# Patient Record
Sex: Female | Born: 1940
Health system: Southern US, Community
[De-identification: ages and names within clinical notes are randomized; demographics above are authoritative.]

## PROBLEM LIST (undated history)

## (undated) DIAGNOSIS — F419 Anxiety disorder, unspecified: Secondary | ICD-10-CM

## (undated) DIAGNOSIS — E785 Hyperlipidemia, unspecified: Secondary | ICD-10-CM

## (undated) DIAGNOSIS — K589 Irritable bowel syndrome without diarrhea: Secondary | ICD-10-CM

## (undated) DIAGNOSIS — M810 Age-related osteoporosis without current pathological fracture: Secondary | ICD-10-CM

## (undated) DIAGNOSIS — I10 Essential (primary) hypertension: Secondary | ICD-10-CM

## (undated) HISTORY — DX: Hyperlipidemia, unspecified: E78.5

## (undated) HISTORY — DX: Anxiety disorder, unspecified: F41.9

## (undated) HISTORY — DX: Irritable bowel syndrome, unspecified: K58.9

## (undated) HISTORY — DX: Age-related osteoporosis without current pathological fracture: M81.0

## (undated) HISTORY — PX: BREAST FIBROADENOMA SURGERY: SHX580

---

## 2010-08-02 ENCOUNTER — Other Ambulatory Visit: Payer: Self-pay | Admitting: Internal Medicine

## 2010-08-02 DIAGNOSIS — Z1231 Encounter for screening mammogram for malignant neoplasm of breast: Secondary | ICD-10-CM

## 2010-08-21 ENCOUNTER — Ambulatory Visit: Payer: Self-pay

## 2010-11-24 ENCOUNTER — Encounter: Payer: Self-pay | Admitting: Internal Medicine

## 2011-08-09 ENCOUNTER — Encounter (HOSPITAL_COMMUNITY): Payer: Self-pay | Admitting: *Deleted

## 2011-08-09 ENCOUNTER — Emergency Department (INDEPENDENT_AMBULATORY_CARE_PROVIDER_SITE_OTHER)
Admission: EM | Admit: 2011-08-09 | Discharge: 2011-08-09 | Disposition: A | Discharge status: Home / Self Care | Payer: Medicare Other | Source: Home / Self Care | Attending: Emergency Medicine | Admitting: Emergency Medicine

## 2011-08-09 DIAGNOSIS — K047 Periapical abscess without sinus: Secondary | ICD-10-CM

## 2011-08-09 HISTORY — DX: Essential (primary) hypertension: I10

## 2011-08-09 MED ORDER — PENICILLIN V POTASSIUM 500 MG PO TABS: 500.0000 mg | ORAL_TABLET | Freq: Four times a day (QID) | ORAL | Status: AC

## 2011-08-09 MED ORDER — HYDROCODONE-ACETAMINOPHEN 5-325 MG PO TABS: 2.0000 | ORAL_TABLET | ORAL | Status: AC | PRN

## 2011-08-09 NOTE — ED Provider Notes (Addendum)
History     CSN: [id number]  Arrival date & time 08/09/11  1741   First MD Initiated Contact with Patient 08/09/11 1743      Chief Complaint  Patient presents with  . Dental Pain    (Consider location/radiation/quality/duration/timing/severity/associated sxs/prior treatment) HPI Comments: Carla Moore was feeling some throbbing pain on my right lower tooth but this morning woke up with swelling and more pain and looks like him have a little pocket beside my tooth there., Dentist, to the emergency department to get antibiotics. He will see me but they are close Friday.  Patient is a 71 y.o. female presenting with tooth pain. The history is provided by the patient.  Dental PainThe primary symptoms include mouth pain. Primary symptoms do not include oral bleeding, fever, sore throat, angioedema or cough. The symptoms began 2 days ago. The symptoms are worsening. The symptoms occur constantly.  Additional symptoms include: gum swelling, gum tenderness, facial swelling and pain with swallowing. Additional symptoms do not include: smell disturbance and fatigue.    Past Medical History  Diagnosis Date  . Hypertension     History reviewed. No pertinent past surgical history.  History reviewed. No pertinent family history.  History  Substance Use Topics  . Smoking status: Not on file  . Smokeless tobacco: Not on file  . Alcohol Use: Yes     2 drinks daily    OB History    Grav Para Term Preterm Abortions TAB SAB Ect Mult Living                  Review of Systems  Constitutional: Negative for fever, appetite change and fatigue.  HENT: Positive for facial swelling. Negative for sore throat.   Eyes: Negative for pain.  Respiratory: Negative for cough.   Cardiovascular: Negative for chest pain.  Genitourinary: Negative for dysuria and flank pain.  Musculoskeletal: Negative for arthralgias.    Allergies  Review of patient's allergies indicates no known allergies.  Home  Medications   Current Outpatient Rx  Name Route Sig Dispense Refill  . ATENOLOL PO Oral Take by mouth.    Marland Kitchen LISINOPRIL 20 MG PO TABS Oral Take 20 mg by mouth daily.    Marland Kitchen HYDROCODONE-ACETAMINOPHEN 5-325 MG PO TABS Oral Take 2 tablets by mouth every 4 (four) hours as needed for pain. 10 tablet 0  . PENICILLIN V POTASSIUM 500 MG PO TABS Oral Take 1 tablet (500 mg total) by mouth 4 (four) times daily. 30 tablet 0    BP 143/62  Pulse 53  Temp(Src) 97.3 F (36.3 C) (Oral)  Resp 16  SpO2 100%  Physical Exam  Nursing note and vitals reviewed. Constitutional: She appears well-developed and well-nourished. No distress.  HENT:  Head: Normocephalic.  Eyes: Conjunctivae are normal.  Neck: Neck supple.  Cardiovascular: Exam reveals no gallop.   No murmur heard. Pulmonary/Chest: No respiratory distress.  Abdominal: Soft. She exhibits no distension.  Skin: No erythema.    ED Course  Procedures (including critical care time)  Labs Reviewed - No data to display No results found.   No diagnosis found.    MDM  Right sided premolar with early abscess formation.        Carla Molly, MD 08/09/11 Carla Moore  Carla Molly, MD 08/09/11 [phone]

## 2011-08-09 NOTE — ED Notes (Signed)
Started with pain in right lower tooth this AM; reports feeling "a pocket".  Pt visiting from out of town.

## 2013-09-23 ENCOUNTER — Emergency Department (HOSPITAL_COMMUNITY)
Admission: EM | Admit: 2013-09-23 | Discharge: 2013-09-23 | Disposition: A | Discharge status: Home / Self Care | Payer: Medicare Other | Source: Home / Self Care

## 2013-09-23 ENCOUNTER — Encounter (HOSPITAL_COMMUNITY): Payer: Self-pay | Admitting: Emergency Medicine

## 2013-09-23 DIAGNOSIS — M79646 Pain in unspecified finger(s): Secondary | ICD-10-CM

## 2013-09-23 DIAGNOSIS — M79609 Pain in unspecified limb: Secondary | ICD-10-CM

## 2013-09-23 DIAGNOSIS — I10 Essential (primary) hypertension: Secondary | ICD-10-CM

## 2013-09-23 DIAGNOSIS — E785 Hyperlipidemia, unspecified: Secondary | ICD-10-CM

## 2013-09-23 LAB — COMPREHENSIVE METABOLIC PANEL
ALBUMIN: 4.5 g/dL (ref 3.5–5.2)
ALK PHOS: 56 U/L (ref 39–117)
ALT: 22 U/L (ref 0–35)
AST: 31 U/L (ref 0–37)
BILIRUBIN TOTAL: 0.9 mg/dL (ref 0.3–1.2)
BUN: 15 mg/dL (ref 6–23)
CHLORIDE: 96 meq/L (ref 96–112)
CO2: 25 mEq/L (ref 19–32)
CREATININE: 0.78 mg/dL (ref 0.50–1.10)
Calcium: 9.6 mg/dL (ref 8.4–10.5)
GFR calc Af Amer: >90 mL/min (ref >90)
GFR calc non Af Amer: 82 mL/min — ABNORMAL LOW (ref >90)
Glucose, Bld: 91 mg/dL (ref 70–99)
POTASSIUM: 4.3 meq/L (ref 3.7–5.3)
Sodium: 136 mEq/L — ABNORMAL LOW (ref 137–147)
Total Protein: 7.3 g/dL (ref 6.0–8.3)

## 2013-09-23 LAB — RHEUMATOID FACTOR

## 2013-09-23 LAB — SEDIMENTATION RATE: Sed Rate: 13 mm/hr (ref 0–22)

## 2013-09-23 LAB — URIC ACID: URIC ACID, SERUM: 6.4 mg/dL (ref 2.4–7.0)

## 2013-09-23 LAB — C-REACTIVE PROTEIN: CRP: <0.5 mg/dL — ABNORMAL LOW (ref <0.60)

## 2013-09-23 NOTE — Discharge Instructions (Signed)
The cause of your finger pain is not clear but may be related to gout, osteoarthritis, or rheumatoid arthritis (though unlikely ).  Pelase start taking 1-2 Aleve twice a day for 1-2 weeks Please follow up on your blood work You no longer need cholesterol medications.

## 2013-09-23 NOTE — ED Notes (Signed)
Pt  Reports  Symptoms  Of  pain  In  r  Hand  With  Some  Pain when  Closes  Her  Hand  denys  Any  Injury  Cap   Refill  Intact      denys  Any  Injury      No  Obvious  Deformity  Noted

## 2013-09-23 NOTE — ED Provider Notes (Signed)
CSN: [id number]     Arrival date & time 09/23/13  1315 History   None    Chief Complaint  Patient presents with  . Hand Problem   (Consider location/radiation/quality/duration/timing/severity/associated sxs/prior Treatment) HPI  R index finger pain: started 4 wks ago. Worse in the am. No recent trauma. Ache in nature. Non-radiating. Worse w/ flexion. Pain improves as the day goes on. Not taking medications. No change in status over the past couple of weeks.   HLD: pt reports taking cholesterol medications in the past. Ordered by physician back in [facility]. Out now but presenting lipid panel labs showing total cholesterol 216, HDL 100, LDL 100. A1c 5.7  HTN: Denies CP, SOB, palpiations. Taking atenolol. Self checks at home and in stores at a max of 140.    Past Medical History  Diagnosis Date  . Hypertension    History reviewed. No pertinent past surgical history. History reviewed. No pertinent family history. History  Substance Use Topics  . Smoking status: Not on file  . Smokeless tobacco: Not on file  . Alcohol Use: Yes     Comment: 2 drinks daily   OB History   Grav Para Term Preterm Abortions TAB SAB Ect Mult Living                 Review of Systems  Constitutional: Negative for fever and activity change.  Respiratory: Negative for shortness of breath and wheezing.   Cardiovascular: Negative for chest pain.  Musculoskeletal: Positive for arthralgias. Negative for back pain, neck pain and neck stiffness.  All other systems reviewed and are negative.   Allergies  Review of patient's allergies indicates no known allergies.  Home Medications   Prior to Admission medications   Medication Sig Start Date End Date Taking? Authorizing Provider  ATENOLOL PO Take by mouth.    Historical Provider, MD  lisinopril (PRINIVIL,ZESTRIL) 20 MG tablet Take 20 mg by mouth daily.    Historical Provider, MD   BP 179/81  Pulse 53  Temp(Src) 98 F (36.7 C) (Oral)  Resp 16   SpO2 100% Physical Exam  Constitutional: She is oriented to person, place, and time. She appears well-developed and well-nourished. No distress.  HENT:  Head: Atraumatic.  Eyes: EOM are normal. Pupils are equal, round, and reactive to light.  Neck: Normal range of motion. Neck supple.  Cardiovascular: Normal rate, regular rhythm, normal heart sounds and intact distal pulses.  Exam reveals no gallop.   No murmur heard. Pulmonary/Chest: Effort normal and breath sounds normal. No respiratory distress. She has no wheezes. She exhibits no tenderness.  Abdominal: Soft. Bowel sounds are normal.  Musculoskeletal:  R 3rd and 4th digits w/ mild soft tissue swelling and nml ROM. No effustion or enlargement/displacement of the MCP, PIP, DIP. Mildly ttp  Neurological: She is alert and oriented to person, place, and time.  Skin: Skin is warm and dry. She is not diaphoretic.  Psychiatric: She has a normal mood and affect. Her behavior is normal. Judgment and thought content normal.    ED Course  Procedures (including critical care time) Labs Review Labs Reviewed  URIC ACID  COMPREHENSIVE METABOLIC PANEL  SEDIMENTATION RATE  C-REACTIVE PROTEIN    Imaging Review No results found.   MDM   1. Finger pain   2. HLD (hyperlipidemia)   3. HTN (hypertension)    Etiology of finger pain unclear. May be related to OA, gout, or RA. Unlikely RA.  - Aleve 1-2mg  BID x 7-14 days -  will obtain Rheumatoid factor, sed rate, CRP, Uric Acid level. CMT - Lipid panel reviewed and no need for medical therapy at this time as 10 yr ASCVD risk 5.1.  HTN - no change in thereapy despite elevation today as typically nml. F/u pcp  Linna Darner, MD Family Medicine PGY-3 09/23/2013, 4:08 PM       Waldemar Dickens, MD 09/23/13 [phone]

## 2013-09-24 NOTE — ED Provider Notes (Signed)
Medical screening examination/treatment/procedure(s) were performed by a resident physician or non-physician practitioner and as the supervising physician I was immediately available for consultation/collaboration.  Breyona Swander, MD    Zennie Ayars S Samera Macy, MD 09/24/13 0752 

## 2013-09-27 NOTE — ED Notes (Signed)
Dr. Marily Memos has reviewed labs and no further action needed. [facility] 09/27/2013

## 2015-04-08 ENCOUNTER — Emergency Department (INDEPENDENT_AMBULATORY_CARE_PROVIDER_SITE_OTHER)
Admission: EM | Admit: 2015-04-08 | Discharge: 2015-04-08 | Disposition: A | Discharge status: Home / Self Care | Payer: PPO | Source: Home / Self Care

## 2015-04-08 DIAGNOSIS — A46 Erysipelas: Secondary | ICD-10-CM | POA: Diagnosis not present

## 2015-04-08 DIAGNOSIS — L03811 Cellulitis of head [any part, except face]: Secondary | ICD-10-CM | POA: Diagnosis not present

## 2015-04-08 MED ORDER — PREDNISONE 20 MG PO TABS: ORAL_TABLET | ORAL | Status: DC

## 2015-04-08 MED ORDER — METHYLPREDNISOLONE SODIUM SUCC 125 MG IJ SOLR
INTRAMUSCULAR | Status: AC
  Filled 2015-04-08: qty 2

## 2015-04-08 MED ORDER — METHYLPREDNISOLONE SODIUM SUCC 125 MG IJ SOLR
125.0000 mg | Freq: Once | INTRAMUSCULAR | Status: AC
  Administered 2015-04-08: 125 mg via INTRAMUSCULAR

## 2015-04-08 MED ORDER — CEPHALEXIN 500 MG PO CAPS: 250.0000 mg | ORAL_CAPSULE | Freq: Three times a day (TID) | ORAL | Status: DC

## 2015-04-08 NOTE — ED Notes (Signed)
Pt here with swelling and bite marks all over after staying overight in [place] Pruritis noted with oozing  BP elevated 191/76 took only one BP med

## 2015-04-08 NOTE — Discharge Instructions (Signed)
Erysipelas Erysipelas is an infection that affects the skin and the tissues that are near the surface of the skin. It causes the skin to become red, swollen, and painful. The infection is most common on the legs but may also affect other areas, such as the face. With treatment, the infection usually goes away in a few days. If not treated, the infection can spread or lead to other problems, such as abscesses. CAUSES Erysipelas is caused by bacteria. Most often, it is caused by bacteria called streptococci. The bacteria often enter through a break in the skin, such as a cut, surgical incision, burn, insect bite, open sore, or crack in the skin. Sometimes the source where the bacteria entered is not known. RISK FACTORS Some people are at an increased risk for developing erysipelas, including:  Young children.  Elderly people.  People with a weakened body defense system (immune system), such as people with HIV or AIDS.  People who have diabetes.  People who drink too much alcohol.  People who have had recent surgery.  People with yeast infections of the skin.  People who have swollen legs. SIGNS AND SYMPTOMS The infection causes a reddened area on the skin. This reddened area may:  Be painful and swollen.  Have a distinct border around it.  Feel itchy and hot.  Develop blisters. Other symptoms may include:  Fever.  Chills.  Nausea and vomiting.  Swollen glands (lymph nodes).  Headache.  Fatigue.  Loss of appetite. DIAGNOSIS Your health care provider will take your medical history and do a physical exam. He or she will usually be able to diagnose erysipelas by closely examining your skin. TREATMENT Erysipelas can usually be treated effectively with antibiotic medicines. The infection usually gets better within a few days of treatment. HOME CARE INSTRUCTIONS  Take medicines only as directed by your health care provider.  Take your antibiotic medicine as directed by  your health care provider. Finish the antibiotic even if you start to feel better.  If the skin infection is on your leg or arm, elevate the leg or arm to help reduce swelling.  Do not put creams or lotions on the affected area of your skin unless your health care provider instructs you to do that.  Do not share bedding, towels, or washcloths (linens) with other people. Using only your own linens will help to prevent the infection from spreading to others.  Keep all follow-up visits as directed by your health care provider. This is important. SEEK MEDICAL CARE IF:  You have pain or discomfort that is not controlled by medicines.  Your red area of skin gets larger or turns dark in color.  Your skin infection returns in the same area or appears in another area. SEEK IMMEDIATE MEDICAL CARE IF:  Your fever is getting worse.  Your feelings of illness are getting worse.  You notice red streaks coming from the infected area.   This information is not intended to replace advice given to you by your health care provider. Make sure you discuss any questions you have with your health care provider.   Document Released: 01/16/2001 Document Revised: 05/14/2014 Document Reviewed: 12/07/2013 Elsevier Interactive Patient Education 2016 [employer] Cellulitis Cellulitis is an infection of the skin and the tissue under the skin. The infected area is usually red and tender. This happens most often in the arms and lower legs. HOME CARE   Take your antibiotic medicine as told. Finish the medicine even if you start to  feel better.  Keep the infected arm or leg raised (elevated).  Put a warm cloth on the area up to 4 times per day.  Only take medicines as told by your doctor.  Keep all doctor visits as told. GET HELP IF:  You see red streaks on the skin coming from the infected area.  Your red area gets bigger or turns a dark color.  Your bone or joint under the infected area is painful  after the skin heals.  Your infection comes back in the same area or different area.  You have a puffy (swollen) bump in the infected area.  You have new symptoms.  You have a fever. GET HELP RIGHT AWAY IF:   You feel very sleepy.  You throw up (vomit) or have watery poop (diarrhea).  You feel sick and have muscle aches and pains.   This information is not intended to replace advice given to you by your health care provider. Make sure you discuss any questions you have with your health care provider.   Document Released: 10/10/2007 Document Revised: 01/12/2015 Document Reviewed: 07/09/2011 Elsevier Interactive Patient Education [employer].

## 2015-04-08 NOTE — ED Notes (Addendum)
Patient states she stayed in a hotel last PM, and awoke this AM w several "insect bites" on hands, legs. States the bites have been itching, turning red, swelling. NAD, handling secretions well. Area on chin, hands, lower extremities noted to be red. Appears anxious

## 2015-04-13 NOTE — ED Provider Notes (Signed)
CSN: [id number]     Arrival date & time 04/08/15  1636 History   None    Chief Complaint  Patient presents with  . Insect Bite  . Pruritis   (Consider location/radiation/quality/duration/timing/severity/associated sxs/prior Treatment) HPI Pt states that she slept in a hotel yesterday and awoke with itchy bites covering her body. No home treatment no pain, mostly itch.  Past Medical History  Diagnosis Date  . Hypertension    No past surgical history on file. No family history on file. Social History  Substance Use Topics  . Smoking status: Not on file  . Smokeless tobacco: Not on file  . Alcohol Use: Yes     Comment: 2 drinks daily   OB History    No data available     Review of Systems Itchy skin Denies fever, chills, other rash Allergies  Review of patient's allergies indicates no known allergies.  Home Medications   Prior to Admission medications   Medication Sig Start Date End Date Taking? Authorizing Provider  ATENOLOL PO Take by mouth.    Historical Provider, MD  cephALEXin (KEFLEX) 500 MG capsule Take 1 capsule (500 mg total) by mouth 3 (three) times daily. 04/08/15   Konrad Felix, PA  lisinopril (PRINIVIL,ZESTRIL) 20 MG tablet Take 20 mg by mouth daily.    Historical Provider, MD  predniSONE (DELTASONE) 20 MG tablet 40 mg daily for 7 days 04/08/15   Konrad Felix, PA   Meds Ordered and Administered this Visit   Medications  methylPREDNISolone sodium succinate (SOLU-MEDROL) 125 mg/2 mL injection 125 mg (125 mg Intramuscular Given 04/08/15 1811)    There were no vitals taken for this visit. No data found.   Physical Exam  Constitutional: She is oriented to person, place, and time. She appears well-developed and well-nourished. No distress.  HENT:  Head: Normocephalic and atraumatic.  Pulmonary/Chest: Effort normal.  Neurological: She is alert and oriented to person, place, and time.  Skin: Skin is warm. Rash noted.  Papular rash on arms neck thorax  and face. There is some redness and swelling to face. Warm to touch.  Nursing note and vitals reviewed.   ED Course  Procedures (including critical care time)  Labs Review Labs Reviewed - No data to display  Imaging Review No results found.   Visual Acuity Review  Right Eye Distance:   Left Eye Distance:   Bilateral Distance:    Right Eye Near:   Left Eye Near:    Bilateral Near:         MDM   1. Erysipelas   2. Cellulitis of head except face    Unsure of cause of these lesions on patient's body, but they do appear to be diffuse insect bites.  Rx.antibx used (keflex also prednisone) Home in stable condition    Konrad Felix, [place] 04/13/15 1316

## 2015-09-20 DIAGNOSIS — I10 Essential (primary) hypertension: Secondary | ICD-10-CM | POA: Diagnosis not present

## 2015-09-27 DIAGNOSIS — Z23 Encounter for immunization: Secondary | ICD-10-CM | POA: Diagnosis not present

## 2015-09-27 DIAGNOSIS — R69 Illness, unspecified: Secondary | ICD-10-CM | POA: Diagnosis not present

## 2015-09-27 DIAGNOSIS — Z681 Body mass index (BMI) 19 or less, adult: Secondary | ICD-10-CM | POA: Diagnosis not present

## 2015-09-27 DIAGNOSIS — I10 Essential (primary) hypertension: Secondary | ICD-10-CM | POA: Diagnosis not present

## 2015-09-27 DIAGNOSIS — Z1389 Encounter for screening for other disorder: Secondary | ICD-10-CM | POA: Diagnosis not present

## 2015-09-27 DIAGNOSIS — G4700 Insomnia, unspecified: Secondary | ICD-10-CM | POA: Diagnosis not present

## 2015-09-27 DIAGNOSIS — Z0000 Encounter for general adult medical examination without abnormal findings: Secondary | ICD-10-CM | POA: Diagnosis not present

## 2016-09-20 DIAGNOSIS — I10 Essential (primary) hypertension: Secondary | ICD-10-CM | POA: Diagnosis not present

## 2016-09-24 DIAGNOSIS — R69 Illness, unspecified: Secondary | ICD-10-CM | POA: Diagnosis not present

## 2016-09-27 DIAGNOSIS — E784 Other hyperlipidemia: Secondary | ICD-10-CM | POA: Diagnosis not present

## 2016-09-27 DIAGNOSIS — Z1389 Encounter for screening for other disorder: Secondary | ICD-10-CM | POA: Diagnosis not present

## 2016-09-27 DIAGNOSIS — R69 Illness, unspecified: Secondary | ICD-10-CM | POA: Diagnosis not present

## 2016-09-27 DIAGNOSIS — I10 Essential (primary) hypertension: Secondary | ICD-10-CM | POA: Diagnosis not present

## 2016-09-27 DIAGNOSIS — Z0000 Encounter for general adult medical examination without abnormal findings: Secondary | ICD-10-CM | POA: Diagnosis not present

## 2016-09-27 DIAGNOSIS — Z681 Body mass index (BMI) 19 or less, adult: Secondary | ICD-10-CM | POA: Diagnosis not present

## 2016-09-27 DIAGNOSIS — G4709 Other insomnia: Secondary | ICD-10-CM | POA: Diagnosis not present

## 2016-10-02 DIAGNOSIS — Z1212 Encounter for screening for malignant neoplasm of rectum: Secondary | ICD-10-CM | POA: Diagnosis not present

## 2016-10-03 DIAGNOSIS — R69 Illness, unspecified: Secondary | ICD-10-CM | POA: Diagnosis not present

## 2017-02-14 ENCOUNTER — Encounter (HOSPITAL_COMMUNITY): Payer: Self-pay | Admitting: Emergency Medicine

## 2017-02-14 ENCOUNTER — Ambulatory Visit (HOSPITAL_COMMUNITY)
Admission: EM | Admit: 2017-02-14 | Discharge: 2017-02-14 | Disposition: A | Discharge status: Home / Self Care | Payer: Medicare HMO | Attending: Family Medicine | Admitting: Family Medicine

## 2017-02-14 DIAGNOSIS — S50861A Insect bite (nonvenomous) of right forearm, initial encounter: Secondary | ICD-10-CM

## 2017-02-14 DIAGNOSIS — W57XXXA Bitten or stung by nonvenomous insect and other nonvenomous arthropods, initial encounter: Secondary | ICD-10-CM

## 2017-02-14 DIAGNOSIS — S50862A Insect bite (nonvenomous) of left forearm, initial encounter: Secondary | ICD-10-CM

## 2017-02-14 DIAGNOSIS — S60562A Insect bite (nonvenomous) of left hand, initial encounter: Secondary | ICD-10-CM | POA: Diagnosis not present

## 2017-02-14 MED ORDER — METHYLPREDNISOLONE ACETATE 80 MG/ML IJ SUSP
80.0000 mg | Freq: Once | INTRAMUSCULAR | Status: AC
  Administered 2017-02-14: 80 mg via INTRAMUSCULAR

## 2017-02-14 MED ORDER — METHYLPREDNISOLONE ACETATE 80 MG/ML IJ SUSP
INTRAMUSCULAR | Status: AC
  Filled 2017-02-14: qty 1

## 2017-02-14 NOTE — Discharge Instructions (Signed)
Your blood pressure is elevated today.  Make sure you take your blood pressure medicine.

## 2017-02-14 NOTE — ED Provider Notes (Signed)
[place]   [id number] 02/14/17 Arrival Time: 1317   SUBJECTIVE:  Carla Moore is a 76 y.o. female who presents to the urgent care with complaint of insect bites.  Patient believes she has been bitten by bedbugs since she's had this in the past.  Overnight she's developed red whelps on her forearms and left hand     Past Medical History:  Diagnosis Date  . Hypertension    No family history on file. Social History   Social History  . Marital status: Divorced    Spouse name: N/A  . Number of children: N/A  . Years of education: N/A   Occupational History  . Not on file.   Social History Main Topics  . Smoking status: Not on file  . Smokeless tobacco: Not on file  . Alcohol use Yes     Comment: 2 drinks daily  . Drug use: No  . Sexual activity: Not on file   Other Topics Concern  . Not on file   Social History Narrative  . No narrative on file   No outpatient prescriptions have been marked as taking for the 02/14/17 encounter [facility] Encounter).   No Known Allergies    ROS: As per HPI, remainder of ROS negative.   OBJECTIVE:   Vitals:   02/14/17 1331  BP: (!) 210/101  Pulse: (!) 56  Resp: 16  Temp: (!) 97.5 F (36.4 C)  TempSrc: Oral  SpO2: 96%  Weight: 98 lb (44.5 kg)  Height: 5' (1.524 m)     General appearance: alert; no distress Eyes: PERRL; EOMI; conjunctiva normal HENT: normocephalic; atraumatic;  external ears normal without trauma; nasal mucosa normal; oral mucosa normal Neck: supple Lungs: no respiratory distress Extremities: no cyanosis or edema; symmetrical with no gross deformities Skin: warm and dry.  Multiple red swollen lesions on arms consistent with bed bug bites. Neurologic: normal gait; grossly normal Psychological: alert and cooperative; normal mood and affect      Labs:  Results for orders placed or performed during the hospital encounter of 09/23/13  Uric acid  Result Value Ref Range   Uric  Acid, Serum 6.4 2.4 - 7.0 mg/dL  Comprehensive metabolic panel  Result Value Ref Range   Sodium 136 (L) 137 - 147 mEq/L   Potassium 4.3 3.7 - 5.3 mEq/L   Chloride 96 96 - 112 mEq/L   CO2 25 19 - 32 mEq/L   Glucose, Bld 91 70 - 99 mg/dL   BUN 15 6 - 23 mg/dL   Creatinine, Ser 0.78 0.50 - 1.10 mg/dL   Calcium 9.6 8.4 - 10.5 mg/dL   Total Protein 7.3 6.0 - 8.3 g/dL   Albumin 4.5 3.5 - 5.2 g/dL   AST 31 0 - 37 U/L   ALT 22 0 - 35 U/L   Alkaline Phosphatase 56 39 - 117 U/L   Total Bilirubin 0.9 0.3 - 1.2 mg/dL   GFR calc non Af Amer 82 (L) >90 mL/min   GFR calc Af Amer >90 >90 mL/min  Sedimentation rate  Result Value Ref Range   Sed Rate 13 0 - 22 mm/hr  C-reactive protein  Result Value Ref Range   CRP <0.5 (L) <0.60 mg/dL  Rheumatoid factor  Result Value Ref Range   Rhuematoid fact SerPl-aCnc <10 <=14 IU/mL    Labs Reviewed - No data to display  No results found.     ASSESSMENT & PLAN:  1. Bedbug bite, initial encounter  Meds ordered this encounter  Medications  . methylPREDNISolone acetate (DEPO-MEDROL) injection 80 mg    Reviewed expectations re: course of current medical issues. Questions answered. Outlined signs and symptoms indicating need for more acute intervention. Patient verbalized understanding. After Visit Summary given.    Procedures:      Robyn Haber, MD 02/14/17 1344

## 2017-02-14 NOTE — ED Triage Notes (Signed)
Seen by dr Joseph Art only

## 2017-02-20 DIAGNOSIS — I10 Essential (primary) hypertension: Secondary | ICD-10-CM | POA: Diagnosis not present

## 2017-02-20 DIAGNOSIS — Z681 Body mass index (BMI) 19 or less, adult: Secondary | ICD-10-CM | POA: Diagnosis not present

## 2017-03-18 DIAGNOSIS — I10 Essential (primary) hypertension: Secondary | ICD-10-CM | POA: Diagnosis not present

## 2017-05-14 DIAGNOSIS — J3489 Other specified disorders of nose and nasal sinuses: Secondary | ICD-10-CM | POA: Diagnosis not present

## 2017-05-14 DIAGNOSIS — G4700 Insomnia, unspecified: Secondary | ICD-10-CM | POA: Diagnosis not present

## 2017-05-14 DIAGNOSIS — I10 Essential (primary) hypertension: Secondary | ICD-10-CM | POA: Diagnosis not present

## 2017-05-14 DIAGNOSIS — R69 Illness, unspecified: Secondary | ICD-10-CM | POA: Diagnosis not present

## 2017-06-11 DIAGNOSIS — I10 Essential (primary) hypertension: Secondary | ICD-10-CM | POA: Diagnosis not present

## 2017-07-09 DIAGNOSIS — I10 Essential (primary) hypertension: Secondary | ICD-10-CM | POA: Diagnosis not present

## 2017-07-19 DIAGNOSIS — Z1329 Encounter for screening for other suspected endocrine disorder: Secondary | ICD-10-CM | POA: Diagnosis not present

## 2017-07-19 DIAGNOSIS — I10 Essential (primary) hypertension: Secondary | ICD-10-CM | POA: Diagnosis not present

## 2017-08-02 DIAGNOSIS — I10 Essential (primary) hypertension: Secondary | ICD-10-CM | POA: Diagnosis not present

## 2017-08-06 DIAGNOSIS — I10 Essential (primary) hypertension: Secondary | ICD-10-CM | POA: Diagnosis not present

## 2017-08-16 DIAGNOSIS — I10 Essential (primary) hypertension: Secondary | ICD-10-CM | POA: Diagnosis not present

## 2017-09-24 DIAGNOSIS — E7849 Other hyperlipidemia: Secondary | ICD-10-CM | POA: Diagnosis not present

## 2017-09-24 DIAGNOSIS — M810 Age-related osteoporosis without current pathological fracture: Secondary | ICD-10-CM | POA: Diagnosis not present

## 2017-09-24 DIAGNOSIS — I10 Essential (primary) hypertension: Secondary | ICD-10-CM | POA: Diagnosis not present

## 2017-09-24 DIAGNOSIS — R82998 Other abnormal findings in urine: Secondary | ICD-10-CM | POA: Diagnosis not present

## 2017-10-01 DIAGNOSIS — Z0000 Encounter for general adult medical examination without abnormal findings: Secondary | ICD-10-CM | POA: Diagnosis not present

## 2017-10-01 DIAGNOSIS — D6489 Other specified anemias: Secondary | ICD-10-CM | POA: Diagnosis not present

## 2017-10-01 DIAGNOSIS — M810 Age-related osteoporosis without current pathological fracture: Secondary | ICD-10-CM | POA: Diagnosis not present

## 2017-10-01 DIAGNOSIS — R69 Illness, unspecified: Secondary | ICD-10-CM | POA: Diagnosis not present

## 2017-10-01 DIAGNOSIS — Z800 Family history of malignant neoplasm of digestive organs: Secondary | ICD-10-CM | POA: Diagnosis not present

## 2017-10-01 DIAGNOSIS — E7849 Other hyperlipidemia: Secondary | ICD-10-CM | POA: Diagnosis not present

## 2017-10-01 DIAGNOSIS — R194 Change in bowel habit: Secondary | ICD-10-CM | POA: Diagnosis not present

## 2017-10-01 DIAGNOSIS — G4709 Other insomnia: Secondary | ICD-10-CM | POA: Diagnosis not present

## 2017-10-01 DIAGNOSIS — I10 Essential (primary) hypertension: Secondary | ICD-10-CM | POA: Diagnosis not present

## 2017-10-01 DIAGNOSIS — N183 Chronic kidney disease, stage 3 (moderate): Secondary | ICD-10-CM | POA: Diagnosis not present

## 2017-10-03 ENCOUNTER — Other Ambulatory Visit: Payer: Self-pay | Admitting: Internal Medicine

## 2017-10-03 DIAGNOSIS — N183 Chronic kidney disease, stage 3 unspecified: Secondary | ICD-10-CM

## 2017-10-03 DIAGNOSIS — I159 Secondary hypertension, unspecified: Secondary | ICD-10-CM

## 2017-10-08 DIAGNOSIS — Z6820 Body mass index (BMI) 20.0-20.9, adult: Secondary | ICD-10-CM | POA: Diagnosis not present

## 2017-10-08 DIAGNOSIS — S00461A Insect bite (nonvenomous) of right ear, initial encounter: Secondary | ICD-10-CM | POA: Diagnosis not present

## 2017-10-08 DIAGNOSIS — S40861A Insect bite (nonvenomous) of right upper arm, initial encounter: Secondary | ICD-10-CM | POA: Diagnosis not present

## 2017-10-08 DIAGNOSIS — Z1212 Encounter for screening for malignant neoplasm of rectum: Secondary | ICD-10-CM | POA: Diagnosis not present

## 2017-10-11 ENCOUNTER — Ambulatory Visit
Admission: RE | Admit: 2017-10-11 | Discharge: 2017-10-11 | Disposition: A | Discharge status: Home / Self Care | Payer: Medicare HMO | Source: Ambulatory Visit | Attending: Internal Medicine | Admitting: Internal Medicine

## 2017-10-11 DIAGNOSIS — I159 Secondary hypertension, unspecified: Secondary | ICD-10-CM

## 2017-10-11 DIAGNOSIS — I129 Hypertensive chronic kidney disease with stage 1 through stage 4 chronic kidney disease, or unspecified chronic kidney disease: Secondary | ICD-10-CM | POA: Diagnosis not present

## 2017-10-11 DIAGNOSIS — N183 Chronic kidney disease, stage 3 unspecified: Secondary | ICD-10-CM

## 2017-10-16 DIAGNOSIS — N183 Chronic kidney disease, stage 3 (moderate): Secondary | ICD-10-CM | POA: Diagnosis not present

## 2017-10-17 DIAGNOSIS — Z681 Body mass index (BMI) 19 or less, adult: Secondary | ICD-10-CM | POA: Diagnosis not present

## 2017-10-17 DIAGNOSIS — S00262A Insect bite (nonvenomous) of left eyelid and periocular area, initial encounter: Secondary | ICD-10-CM | POA: Diagnosis not present

## 2017-10-17 DIAGNOSIS — L03211 Cellulitis of face: Secondary | ICD-10-CM | POA: Diagnosis not present

## 2017-10-18 ENCOUNTER — Other Ambulatory Visit: Payer: Self-pay | Admitting: Internal Medicine

## 2017-10-19 ENCOUNTER — Other Ambulatory Visit: Payer: Self-pay | Admitting: Internal Medicine

## 2017-10-19 DIAGNOSIS — N281 Cyst of kidney, acquired: Secondary | ICD-10-CM

## 2017-10-21 DIAGNOSIS — M5416 Radiculopathy, lumbar region: Secondary | ICD-10-CM | POA: Diagnosis not present

## 2017-10-21 DIAGNOSIS — Z681 Body mass index (BMI) 19 or less, adult: Secondary | ICD-10-CM | POA: Diagnosis not present

## 2017-10-25 ENCOUNTER — Ambulatory Visit
Admission: RE | Admit: 2017-10-25 | Discharge: 2017-10-25 | Disposition: A | Discharge status: Home / Self Care | Payer: Medicare HMO | Source: Ambulatory Visit | Attending: Internal Medicine | Admitting: Internal Medicine

## 2017-10-25 DIAGNOSIS — N281 Cyst of kidney, acquired: Secondary | ICD-10-CM | POA: Diagnosis not present

## 2017-10-25 MED ORDER — GADOBENATE DIMEGLUMINE 529 MG/ML IV SOLN
4.0000 mL | Freq: Once | INTRAVENOUS | Status: AC | PRN
  Administered 2017-10-25: 4 mL via INTRAVENOUS

## 2017-10-28 DIAGNOSIS — I10 Essential (primary) hypertension: Secondary | ICD-10-CM | POA: Diagnosis not present

## 2017-11-19 ENCOUNTER — Encounter: Payer: Self-pay | Admitting: Internal Medicine

## 2017-11-24 ENCOUNTER — Emergency Department (HOSPITAL_COMMUNITY)
Admission: EM | Admit: 2017-11-24 | Discharge: 2017-11-24 | Disposition: A | Discharge status: Home / Self Care | Payer: Medicare HMO | Attending: Emergency Medicine | Admitting: Emergency Medicine

## 2017-11-24 ENCOUNTER — Encounter (HOSPITAL_COMMUNITY): Payer: Self-pay

## 2017-11-24 ENCOUNTER — Emergency Department (HOSPITAL_COMMUNITY): Payer: Medicare HMO

## 2017-11-24 ENCOUNTER — Other Ambulatory Visit: Payer: Self-pay

## 2017-11-24 DIAGNOSIS — L239 Allergic contact dermatitis, unspecified cause: Secondary | ICD-10-CM | POA: Insufficient documentation

## 2017-11-24 DIAGNOSIS — I10 Essential (primary) hypertension: Secondary | ICD-10-CM | POA: Insufficient documentation

## 2017-11-24 DIAGNOSIS — M545 Low back pain: Secondary | ICD-10-CM | POA: Diagnosis not present

## 2017-11-24 DIAGNOSIS — M5432 Sciatica, left side: Secondary | ICD-10-CM | POA: Insufficient documentation

## 2017-11-24 DIAGNOSIS — M79605 Pain in left leg: Secondary | ICD-10-CM | POA: Diagnosis present

## 2017-11-24 DIAGNOSIS — M5430 Sciatica, unspecified side: Secondary | ICD-10-CM

## 2017-11-24 DIAGNOSIS — Z79899 Other long term (current) drug therapy: Secondary | ICD-10-CM | POA: Insufficient documentation

## 2017-11-24 DIAGNOSIS — M5442 Lumbago with sciatica, left side: Secondary | ICD-10-CM | POA: Diagnosis not present

## 2017-11-24 MED ORDER — TRAMADOL HCL 50 MG PO TABS
50.0000 mg | ORAL_TABLET | Freq: Once | ORAL | Status: DC
  Filled 2017-11-24: qty 1

## 2017-11-24 MED ORDER — CYCLOBENZAPRINE HCL 10 MG PO TABS: 10.0000 mg | ORAL_TABLET | Freq: Three times a day (TID) | ORAL | 0 refills | Status: AC | PRN

## 2017-11-24 MED ORDER — METHYLPREDNISOLONE SODIUM SUCC 125 MG IJ SOLR
125.0000 mg | Freq: Once | INTRAMUSCULAR | Status: AC
  Administered 2017-11-24: 125 mg via INTRAMUSCULAR
  Filled 2017-11-24: qty 2

## 2017-11-24 MED ORDER — CYCLOBENZAPRINE HCL 10 MG PO TABS
10.0000 mg | ORAL_TABLET | Freq: Once | ORAL | Status: AC
  Administered 2017-11-24: 10 mg via ORAL
  Filled 2017-11-24: qty 1

## 2017-11-24 NOTE — ED Triage Notes (Addendum)
Pt states that she was dx with sciatica, and was given a "shot" and an rx for steroids. Pt states that she is not able to take the steroids without getting sick, and that her pain is worse than ever. Pt states no relief with aleve. Pt states that her pain is lowest right after she swims.

## 2017-11-24 NOTE — ED Provider Notes (Signed)
[facility] DEPT Provider Note   CSN: [id number] Arrival date & time: 11/24/17  1042     History   Chief Complaint Chief Complaint  Patient presents with  . Back Pain  . Leg Pain    HPI Carla Moore is a 77 y.o. female.    Patient complains of pain running down her left leg.  She had an injection in her left buttocks at one point and started having this discomfort.  She was put on steroids  once and she had a bad reaction to the steroids.  The history is provided by the patient. No language interpreter was used.  Back Pain   This is a recurrent problem. The current episode started more than 2 days ago. The problem occurs constantly. The problem has not changed since onset.The pain is associated with no known injury. The pain is present in the lumbar spine. The quality of the pain is described as stabbing. The pain radiates to the left foot. The pain is at a severity of 6/10. The pain is moderate. Exacerbated by: Unknown. Associated symptoms include leg pain. Pertinent negatives include no chest pain, no headaches and no abdominal pain. Treatments tried: Aleve. The treatment provided mild relief.  Leg Pain      Past Medical History:  Diagnosis Date  . Hypertension     There are no active problems to display for this patient.   History reviewed. No pertinent surgical history.   OB History   None      Home Medications    Prior to Admission medications   Medication Sig Start Date End Date Taking? Authorizing Provider  atorvastatin (LIPITOR) 10 MG tablet Take 10 mg by mouth at bedtime. 10/01/17  Yes [provider]  cycloSPORINE (RESTASIS) 0.05 % ophthalmic emulsion Place 2 drops into both eyes 2 (two) times daily.   Yes [provider]  diphenhydrAMINE (BENADRYL ALLERGY) 25 MG tablet Take 50 mg by mouth every 6 (six) hours as needed for itching.   Yes [provider]  hydrocortisone cream 0.5 % Apply 1  application topically 2 (two) times daily as needed for itching.   Yes [provider]  Magnesium 250 MG TABS Take 500 mg by mouth at bedtime.   Yes [provider]  metoprolol succinate (TOPROL-XL) 50 MG 24 hr tablet Take 50 mg by mouth See admin instructions. Pt takes 25mg  with breakfast and 50mg  at bedtime 10/05/17  Yes [provider]  minoxidil (LONITEN) 2.5 MG tablet Take 2.5 mg by mouth daily. 10/24/17  Yes [provider]  naproxen sodium (ALEVE) 220 MG tablet Take 660 mg by mouth daily as needed (leg pain).   Yes [provider]  OLMESARTAN MEDOXOMIL PO Take 40 mg by mouth daily.   Yes [provider]  Omega-3 1000 MG CAPS Take 1,000 mg by mouth 2 (two) times daily.   Yes [provider]  temazepam (RESTORIL) 15 MG capsule Take 15 mg by mouth at bedtime as needed for sleep. 11/08/17  Yes [provider]  vitamin B-12 (CYANOCOBALAMIN) 1000 MCG tablet Take 1,000 mcg by mouth at bedtime.   Yes [provider]  cyclobenzaprine (FLEXERIL) 10 MG tablet Take 1 tablet (10 mg total) by mouth 3 (three) times daily as needed for muscle spasms. 11/24/17   Milton Ferguson, MD    Family History No family history on file.  Social History Social History   Tobacco Use  . Smoking status: Never Smoker  .  Smokeless tobacco: Never Used  Substance Use Topics  . Alcohol use: Yes    Comment: 2 drinks daily  . Drug use: No     Allergies   Prednisone   Review of Systems Review of Systems  Constitutional: Negative for appetite change and fatigue.  HENT: Negative for congestion, ear discharge and sinus pressure.   Eyes: Negative for discharge.  Respiratory: Negative for cough.   Cardiovascular: Negative for chest pain.  Gastrointestinal: Negative for abdominal pain and diarrhea.  Genitourinary: Negative for frequency and hematuria.  Musculoskeletal: Positive for back pain.  Skin: Negative for rash.       Rash to neck    Neurological: Negative for seizures and headaches.  Psychiatric/Behavioral: Negative for hallucinations.     Physical Exam Updated Vital Signs BP (!) 156/85 (BP Location: Right Arm)   Pulse 61   Temp 97.8 F (36.6 C) (Oral)   Resp 14   Ht 5' (1.524 m)   Wt 45.4 kg (100 lb)   SpO2 100%   BMI 19.53 kg/m   Physical Exam  Constitutional: She is oriented to person, place, and time. She appears well-developed.  HENT:  Head: Normocephalic.  Eyes: Conjunctivae and EOM are normal. No scleral icterus.  Neck: Neck supple. No thyromegaly present.  Cardiovascular: Normal rate and regular rhythm. Exam reveals no gallop and no friction rub.  No murmur heard. Pulmonary/Chest: No stridor. She has no wheezes. She has no rales. She exhibits no tenderness.  Abdominal: She exhibits no distension. There is no tenderness. There is no rebound.  Musculoskeletal: Normal range of motion. She exhibits no edema.  Tender lumbar spine.Marland Kitchen  Positive straight leg raise on left  Lymphadenopathy:    She has no cervical adenopathy.  Neurological: She is oriented to person, place, and time. She exhibits normal muscle tone. Coordination normal.  Skin: Rash noted. There is erythema.  Psychiatric: She has a normal mood and affect. Her behavior is normal.     ED Treatments / Results  Labs (all labs ordered are listed, but only abnormal results are displayed) Labs Reviewed - No data to display  EKG None  Radiology Dg Lumbar Spine Complete  Result Date: 11/24/2017 CLINICAL DATA:  Left-sided sciatic pain with left hip, thigh and foot pain. EXAM: LUMBAR SPINE - COMPLETE 4+ VIEW COMPARISON:  None. FINDINGS: Subtle curvature convex left. There is mild to moderate spondylosis of the lumbar spine to include facet arthropathy most prominent over the lower lumbar spine. Mild disc space narrowing at the L2-3 level and to lesser extent at the L1-2 and L3-4 levels. No compression fracture or spondylolisthesis.  IMPRESSION: No acute findings. Mild to moderate spondylosis of the lumbar spine to include facet arthropathy. Multilevel disc disease as described most prominent at the L2-3 level. Electronically Signed   By: Marin Olp M.D.   On: 11/24/2017 12:09    Procedures Procedures (including critical care time)  Medications Ordered in ED Medications  methylPREDNISolone sodium succinate (SOLU-MEDROL) 125 mg/2 mL injection 125 mg (has no administration in time range)  cyclobenzaprine (FLEXERIL) tablet 10 mg (10 mg Oral Given 11/24/17 1142)     Initial Impression / Assessment and Plan / ED Course  I have reviewed the triage vital signs and the nursing notes.  Pertinent labs & imaging results that were available during my care of the patient were reviewed by me and considered in my medical decision making (see chart for details).     Patient with sciatic pain on the left  side.  She will be placed on Aleve and Flexeril.  She does not want any narcotics.  Patient also has an allergic dermatitis to her neck and is given 1 injection of steroids.  She states she has bad reaction to steroids but she will take this 1 for this rash.  She also take Benadryl and follow-up with her PCP  Final Clinical Impressions(s) / ED Diagnoses   Final diagnoses:  Sciatica, unspecified laterality  Allergic dermatitis    ED Discharge Orders        Ordered    cyclobenzaprine (FLEXERIL) 10 MG tablet  3 times daily PRN     11/24/17 1344       Milton Ferguson, MD 11/24/17 1350

## 2017-11-24 NOTE — Discharge Instructions (Addendum)
Take Benadryl 25 mg every 6 hours as needed for rash and itching.  Take your Aleve 2 pills twice a day for your back.  Follow-up with your family doctor later this week or next week.

## 2017-11-24 NOTE — ED Notes (Signed)
Patient transported to X-ray 

## 2017-11-25 ENCOUNTER — Other Ambulatory Visit: Payer: Self-pay

## 2017-11-28 DIAGNOSIS — E7849 Other hyperlipidemia: Secondary | ICD-10-CM | POA: Diagnosis not present

## 2017-11-29 ENCOUNTER — Encounter

## 2017-11-29 ENCOUNTER — Other Ambulatory Visit (INDEPENDENT_AMBULATORY_CARE_PROVIDER_SITE_OTHER): Payer: Medicare HMO

## 2017-11-29 ENCOUNTER — Encounter: Payer: Self-pay | Admitting: Internal Medicine

## 2017-11-29 ENCOUNTER — Ambulatory Visit: Payer: Medicare HMO | Admitting: Internal Medicine

## 2017-11-29 VITALS — BP 150/70 | HR 72 | Ht 59.0 in | Wt 97.0 lb

## 2017-11-29 DIAGNOSIS — R194 Change in bowel habit: Secondary | ICD-10-CM

## 2017-11-29 DIAGNOSIS — D539 Nutritional anemia, unspecified: Secondary | ICD-10-CM

## 2017-11-29 DIAGNOSIS — K8689 Other specified diseases of pancreas: Secondary | ICD-10-CM | POA: Diagnosis not present

## 2017-11-29 LAB — CBC WITH DIFFERENTIAL/PLATELET
BASOS ABS: 0 10*3/uL (ref 0.0–0.1)
Basophils Relative: 0.6 % (ref 0.0–3.0)
Eosinophils Absolute: 0.2 10*3/uL (ref 0.0–0.7)
Eosinophils Relative: 2.6 % (ref 0.0–5.0)
HCT: 29.7 % — ABNORMAL LOW (ref 36.0–46.0)
Hemoglobin: 10.4 g/dL — ABNORMAL LOW (ref 12.0–15.0)
LYMPHS ABS: 2.6 10*3/uL (ref 0.7–4.0)
Lymphocytes Relative: 33.1 % (ref 12.0–46.0)
MCHC: 35.2 g/dL (ref 30.0–36.0)
MCV: 98.3 fl (ref 78.0–100.0)
MONO ABS: 1 10*3/uL (ref 0.1–1.0)
Monocytes Relative: 12.2 % — ABNORMAL HIGH (ref 3.0–12.0)
NEUTROS ABS: 4 10*3/uL (ref 1.4–7.7)
NEUTROS PCT: 51.5 % (ref 43.0–77.0)
Platelets: 442 10*3/uL — ABNORMAL HIGH (ref 150.0–400.0)
RBC: 3.02 Mil/uL — ABNORMAL LOW (ref 3.87–5.11)
RDW: 13.6 % (ref 11.5–15.5)
WBC: 7.8 10*3/uL (ref 4.0–10.5)

## 2017-11-29 LAB — VITAMIN B12: Vitamin B-12: >1500 pg/mL — ABNORMAL HIGH (ref 211–911)

## 2017-11-29 MED ORDER — PANCRELIPASE (LIP-PROT-AMYL) 36000-114000 UNITS PO CPEP: 36000.0000 [IU] | ORAL_CAPSULE | Freq: Every day | ORAL | 2 refills | Status: AC

## 2017-11-29 NOTE — Patient Instructions (Signed)
We have sent the following medications to your pharmacy for you to pick up at your convenience:  Creon  Your provider has requested that you go to the basement level for lab work before leaving today. Press "B" on the elevator. The lab is located at the first door on the left as you exit the elevator.

## 2017-11-29 NOTE — Progress Notes (Addendum)
HISTORY OF PRESENT ILLNESS:  Carla Moore is a 77 y.o. female , native of [place], who is sent by Dr. Ardeth Perfect regarding change in bowel habits and possible pancreatic insufficiency. Patient is a suboptimal historian. She reports an 812 month history of change in bowel habits. She describes problems with chronic diarrhea excessive gas and long thin or pellet type stools. She mentioned that she noticed oil. She was placed on Creon 36,000 units of lipase which she has been taking once daily with her evening meal. She states that this immediately resolved all of her symptoms. Review of outside blood work shows macrocytic anemia (hemoglobin 10.5 and MCV 101.1). She takes oral B12 on her own. She tells me that she has undergone 3 or 4 colonoscopies in [place]. She tells me that the first colonoscopy there were 2 polyps. Subsequent colonoscopies there were no polyps including her last one about 5 years ago. Patient denies weight loss. No bleeding. Currently little in the way of abdominal complaints. She does request a prescription for Creon. Patient reports consuming 2 alcoholic beverages per day in the form of red wine. She cannot be very specific regarding the exact quantity. The patient did undergo an MRI of the abdomen with and without contrast 10/25/2017. Pancreas was unremarkable  REVIEW OF SYSTEMS:  All non-GI ROS negative except for anxiety, insomnia  Past Medical History:  Diagnosis Date  . Anxiety   . Hyperlipidemia   . Hypertension   . IBS (irritable bowel syndrome)   . Osteoporosis     Past Surgical History:  Procedure Laterality Date  . BREAST FIBROADENOMA SURGERY Bilateral    in her 21's    Social History EMILLEE Moore  reports that she has never smoked. She has never used smokeless tobacco. She reports that she drinks alcohol. She reports that she does not use drugs.  family history includes Gout in her father; Kidney disease in her father.  Allergies  Allergen  Reactions  . Prednisone Anxiety       PHYSICAL EXAMINATION: Vital signs: BP (!) 150/70   Pulse 72   Ht 4\' 11"  (1.499 m)   Wt 97 lb (44 kg)   BMI 19.59 kg/m   Constitutional: thin,generally well-appearing, no acute distress Psychiatric: alert and oriented x3, cooperative Eyes: extraocular movements intact, anicteric, conjunctiva pink Mouth: oral pharynx moist, no lesions Neck: supple no lymphadenopathy Cardiovascular: heart regular rate and rhythm, no murmur Lungs: clear to auscultation bilaterally Abdomen: soft, nontender, nondistended, no obvious ascites, no peritoneal signs, normal bowel sounds, no organomegaly Rectal:omitted Extremities: no clubbing, cyanosis, or lower extremity edema bilaterally Skin: no lesions on visible extremities Neuro: No focal deficits. Normal DTRs. Cranial nerves intact  ASSESSMENT:  #1. Pancreatic insufficiency by history and clinical response. Pancreas on imaging. She does use alcohol chronically and may have microscopic ductal disease #2. Macrocytic anemia #3. Remote history of colon polyp. Multiple subsequent colonoscopies in [place] negative per patient report. Has aged out of surveillance based on this history and her current age   PLAN:  #1. Multiple samples of Creon and a prescription provided #2. Limited alcohol use #3. Repeat CBC and B12 level today #4. Ongoing general medical care with Dr. Ardeth Perfect #5. GI follow-up as needed  ADDENDUM Laboratories have returned. Hemoglobin 10.4, platelets 442,000, MCV 98.3, B12 greater than 1500. We did call these results to the patient today  ADDENDUM 01/09/2018: Outside records from [place] received. The patient had diverticulosis and internal hemorrhoids on colonoscopy performed 11/24/2010. She needs no  further follow-up for routine screening

## 2017-12-06 DIAGNOSIS — Z681 Body mass index (BMI) 19 or less, adult: Secondary | ICD-10-CM | POA: Diagnosis not present

## 2017-12-06 DIAGNOSIS — W57XXXA Bitten or stung by nonvenomous insect and other nonvenomous arthropods, initial encounter: Secondary | ICD-10-CM | POA: Diagnosis not present

## 2017-12-06 DIAGNOSIS — L299 Pruritus, unspecified: Secondary | ICD-10-CM | POA: Diagnosis not present

## 2017-12-24 DIAGNOSIS — D649 Anemia, unspecified: Secondary | ICD-10-CM | POA: Diagnosis not present

## 2017-12-24 DIAGNOSIS — Z131 Encounter for screening for diabetes mellitus: Secondary | ICD-10-CM | POA: Diagnosis not present

## 2017-12-24 DIAGNOSIS — I10 Essential (primary) hypertension: Secondary | ICD-10-CM | POA: Diagnosis not present

## 2017-12-24 DIAGNOSIS — E871 Hypo-osmolality and hyponatremia: Secondary | ICD-10-CM | POA: Diagnosis not present

## 2017-12-24 DIAGNOSIS — D509 Iron deficiency anemia, unspecified: Secondary | ICD-10-CM | POA: Diagnosis not present

## 2017-12-31 ENCOUNTER — Telehealth: Payer: Self-pay | Admitting: Internal Medicine

## 2017-12-31 NOTE — Telephone Encounter (Signed)
Spoke with patient and told her I had faxed the corrected form they sent me, reflecting the quantity of 100 per their requested.  I will call them tomorrow to try to get a final answer.  Patient agreed.

## 2018-01-02 NOTE — Telephone Encounter (Signed)
Spoke with Dina Rich who told me they needed another copy of the patient's 3212 as well as a list of patient's medications and what they cost.  Patient refaxed [phone].  Pharmacy informed me that patient will have to come in the pharmacy and sign something to get the requested list.  Since patient is out of town in [place] she will have to go to a [facility] there to do so.  Patient agreed to go to [facility] where she is to try to get list of meds.  I called [facility] regarding her Benicar - they will fax me a printout showing the med and its price.  When I have everything I will fax it to [employer].

## 2018-01-03 NOTE — Telephone Encounter (Signed)
Faxed corrected Creon application, list of meds and prices provided by patient, and [phone] to [employer].  Requested they contact me to let me know if application was approved

## 2018-01-03 NOTE — Telephone Encounter (Signed)
Patient is calling in regarding this. She is wanting to know if our office received the fax. Best # [phone]

## 2018-01-03 NOTE — Telephone Encounter (Signed)
Pt is calling and wants to know if we have received the fax from [facility]?

## 2018-01-14 NOTE — Telephone Encounter (Signed)
Creon assistance approved

## 2018-01-24 DIAGNOSIS — I10 Essential (primary) hypertension: Secondary | ICD-10-CM | POA: Diagnosis not present

## 2018-01-28 DIAGNOSIS — M216X1 Other acquired deformities of right foot: Secondary | ICD-10-CM | POA: Diagnosis not present

## 2018-01-28 DIAGNOSIS — M2011 Hallux valgus (acquired), right foot: Secondary | ICD-10-CM | POA: Diagnosis not present

## 2018-02-10 DIAGNOSIS — M2021 Hallux rigidus, right foot: Secondary | ICD-10-CM | POA: Diagnosis not present

## 2018-04-17 DIAGNOSIS — I10 Essential (primary) hypertension: Secondary | ICD-10-CM | POA: Diagnosis not present

## 2018-04-23 ENCOUNTER — Telehealth: Payer: Self-pay

## 2018-04-23 NOTE — Telephone Encounter (Signed)
Faxed [employer] assistance paperwork for Creon

## 2018-05-08 DIAGNOSIS — Z8679 Personal history of other diseases of the circulatory system: Secondary | ICD-10-CM | POA: Diagnosis not present

## 2018-05-08 DIAGNOSIS — Z800 Family history of malignant neoplasm of digestive organs: Secondary | ICD-10-CM | POA: Diagnosis not present

## 2018-05-13 DIAGNOSIS — Z800 Family history of malignant neoplasm of digestive organs: Secondary | ICD-10-CM | POA: Diagnosis not present

## 2018-05-14 DIAGNOSIS — K635 Polyp of colon: Secondary | ICD-10-CM | POA: Diagnosis not present

## 2018-05-14 DIAGNOSIS — K5730 Diverticulosis of large intestine without perforation or abscess without bleeding: Secondary | ICD-10-CM | POA: Diagnosis not present

## 2018-05-14 DIAGNOSIS — Z1211 Encounter for screening for malignant neoplasm of colon: Secondary | ICD-10-CM | POA: Diagnosis not present

## 2018-05-14 DIAGNOSIS — K648 Other hemorrhoids: Secondary | ICD-10-CM | POA: Diagnosis not present

## 2018-05-14 DIAGNOSIS — D122 Benign neoplasm of ascending colon: Secondary | ICD-10-CM | POA: Diagnosis not present

## 2018-05-22 NOTE — Telephone Encounter (Signed)
Lm on vm that [employer] had approved patient's assistance for Creon.  I told her to call with any questions or concerns.

## 2018-06-23 DIAGNOSIS — J208 Acute bronchitis due to other specified organisms: Secondary | ICD-10-CM | POA: Diagnosis not present

## 2018-07-18 DIAGNOSIS — G4700 Insomnia, unspecified: Secondary | ICD-10-CM | POA: Diagnosis not present

## 2018-07-18 DIAGNOSIS — I10 Essential (primary) hypertension: Secondary | ICD-10-CM | POA: Diagnosis not present

## 2018-08-06 DIAGNOSIS — E559 Vitamin D deficiency, unspecified: Secondary | ICD-10-CM | POA: Diagnosis not present

## 2018-08-06 DIAGNOSIS — Z1329 Encounter for screening for other suspected endocrine disorder: Secondary | ICD-10-CM | POA: Diagnosis not present

## 2018-08-06 DIAGNOSIS — Z131 Encounter for screening for diabetes mellitus: Secondary | ICD-10-CM | POA: Diagnosis not present

## 2018-08-06 DIAGNOSIS — I10 Essential (primary) hypertension: Secondary | ICD-10-CM | POA: Diagnosis not present

## 2018-08-06 DIAGNOSIS — D649 Anemia, unspecified: Secondary | ICD-10-CM | POA: Diagnosis not present

## 2018-08-06 DIAGNOSIS — E538 Deficiency of other specified B group vitamins: Secondary | ICD-10-CM | POA: Diagnosis not present

## 2018-08-12 DIAGNOSIS — G4700 Insomnia, unspecified: Secondary | ICD-10-CM | POA: Diagnosis not present

## 2018-08-12 DIAGNOSIS — I10 Essential (primary) hypertension: Secondary | ICD-10-CM | POA: Diagnosis not present

## 2018-08-12 DIAGNOSIS — D649 Anemia, unspecified: Secondary | ICD-10-CM | POA: Diagnosis not present

## 2018-08-12 DIAGNOSIS — D509 Iron deficiency anemia, unspecified: Secondary | ICD-10-CM | POA: Diagnosis not present

## 2018-08-19 DIAGNOSIS — Z1211 Encounter for screening for malignant neoplasm of colon: Secondary | ICD-10-CM | POA: Diagnosis not present

## 2018-10-14 DIAGNOSIS — E871 Hypo-osmolality and hyponatremia: Secondary | ICD-10-CM | POA: Diagnosis not present

## 2018-10-14 DIAGNOSIS — D649 Anemia, unspecified: Secondary | ICD-10-CM | POA: Diagnosis not present

## 2018-10-14 DIAGNOSIS — Z1329 Encounter for screening for other suspected endocrine disorder: Secondary | ICD-10-CM | POA: Diagnosis not present

## 2018-10-14 DIAGNOSIS — I10 Essential (primary) hypertension: Secondary | ICD-10-CM | POA: Diagnosis not present

## 2018-10-16 DIAGNOSIS — N390 Urinary tract infection, site not specified: Secondary | ICD-10-CM | POA: Diagnosis not present

## 2018-10-16 DIAGNOSIS — I10 Essential (primary) hypertension: Secondary | ICD-10-CM | POA: Diagnosis not present

## 2018-10-16 DIAGNOSIS — N183 Chronic kidney disease, stage 3 (moderate): Secondary | ICD-10-CM | POA: Diagnosis not present

## 2018-10-16 DIAGNOSIS — D649 Anemia, unspecified: Secondary | ICD-10-CM | POA: Diagnosis not present

## 2018-11-24 DIAGNOSIS — N390 Urinary tract infection, site not specified: Secondary | ICD-10-CM | POA: Diagnosis not present

## 2018-12-05 DIAGNOSIS — L570 Actinic keratosis: Secondary | ICD-10-CM | POA: Diagnosis not present

## 2018-12-05 DIAGNOSIS — D485 Neoplasm of uncertain behavior of skin: Secondary | ICD-10-CM | POA: Diagnosis not present

## 2018-12-15 DIAGNOSIS — N183 Chronic kidney disease, stage 3 (moderate): Secondary | ICD-10-CM | POA: Diagnosis not present

## 2018-12-15 DIAGNOSIS — D649 Anemia, unspecified: Secondary | ICD-10-CM | POA: Diagnosis not present

## 2018-12-15 DIAGNOSIS — N390 Urinary tract infection, site not specified: Secondary | ICD-10-CM | POA: Diagnosis not present

## 2018-12-16 DIAGNOSIS — N183 Chronic kidney disease, stage 3 (moderate): Secondary | ICD-10-CM | POA: Diagnosis not present

## 2018-12-16 DIAGNOSIS — I10 Essential (primary) hypertension: Secondary | ICD-10-CM | POA: Diagnosis not present

## 2018-12-16 DIAGNOSIS — G4700 Insomnia, unspecified: Secondary | ICD-10-CM | POA: Diagnosis not present

## 2018-12-16 DIAGNOSIS — D649 Anemia, unspecified: Secondary | ICD-10-CM | POA: Diagnosis not present

## 2019-07-11 IMAGING — CR DG LUMBAR SPINE COMPLETE 4+V
5 series · 5 of 5 positions shown · non-contrast
Comparison: None.

CLINICAL DATA: Left-sided sciatic pain with left hip, thigh and
foot pain.

EXAM:
LUMBAR SPINE - COMPLETE 4+ VIEW

[t lumbar spine ap]
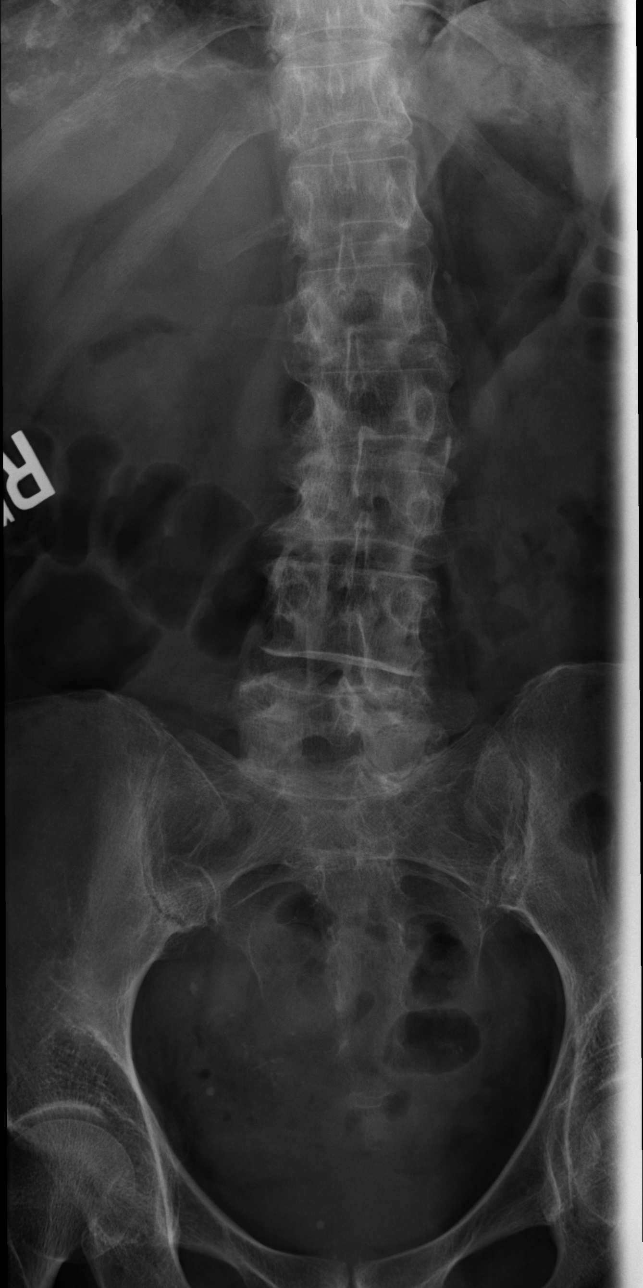

[t lumbar spine obl (1 of 2)]
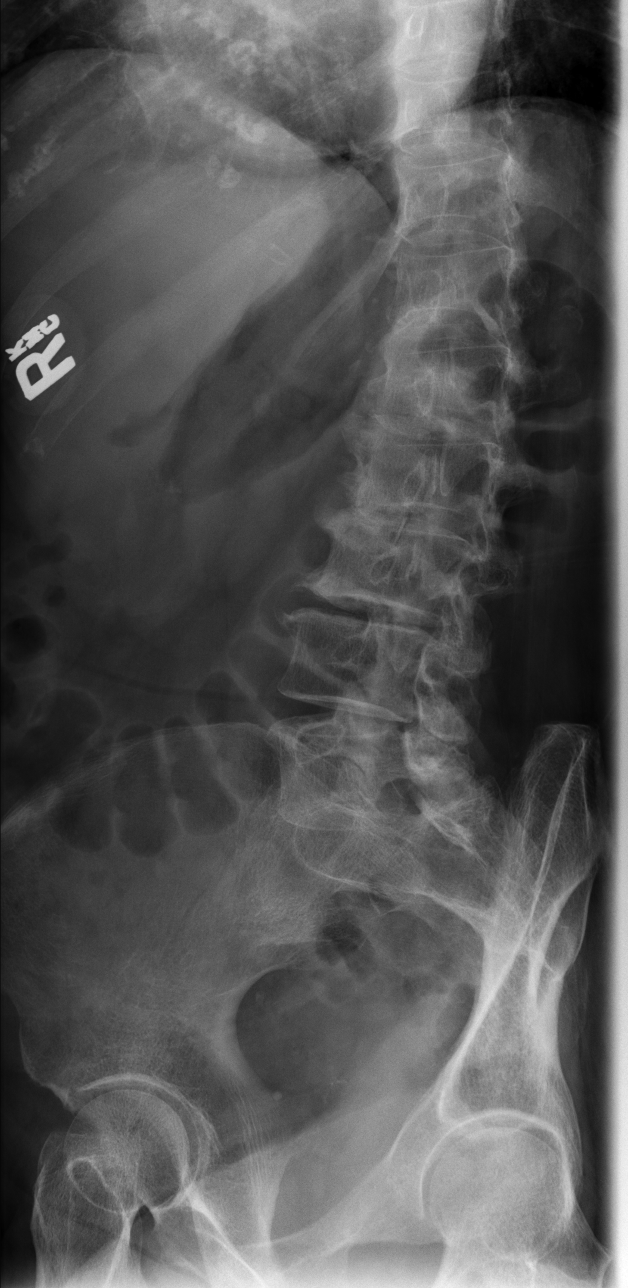

[t lumbar spine obl (2 of 2)]
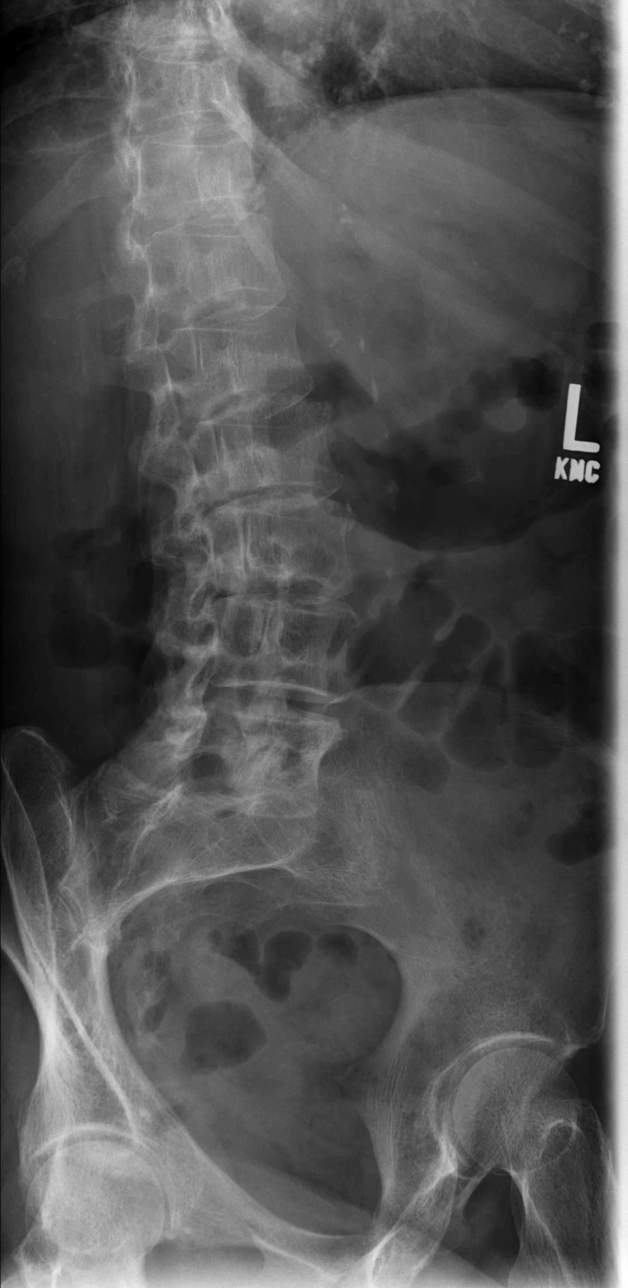

[t lumbar spine lat]
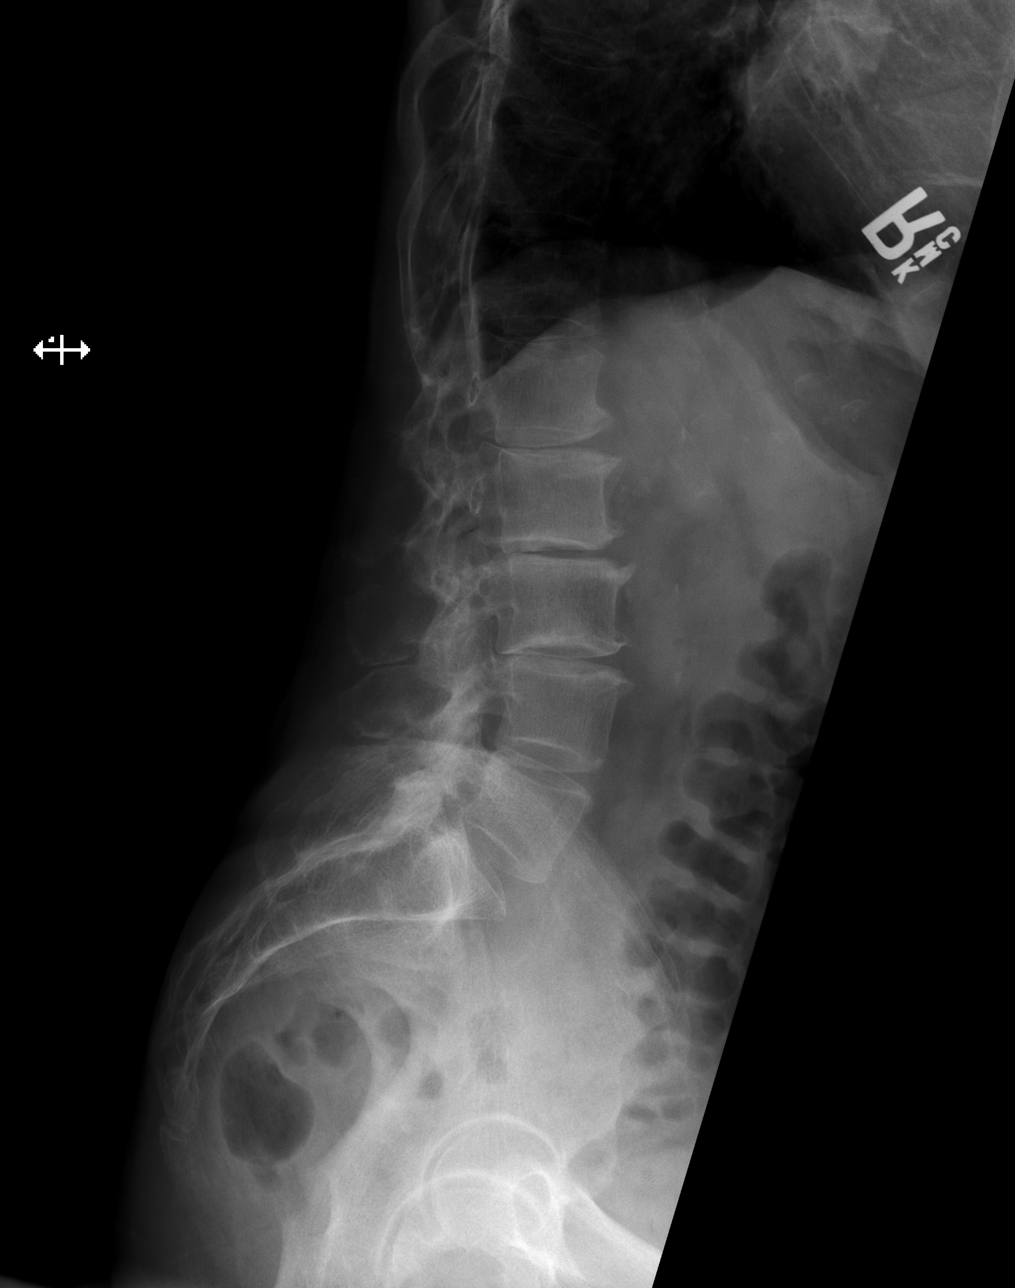

[t lumbar l-5 s-1 spot]
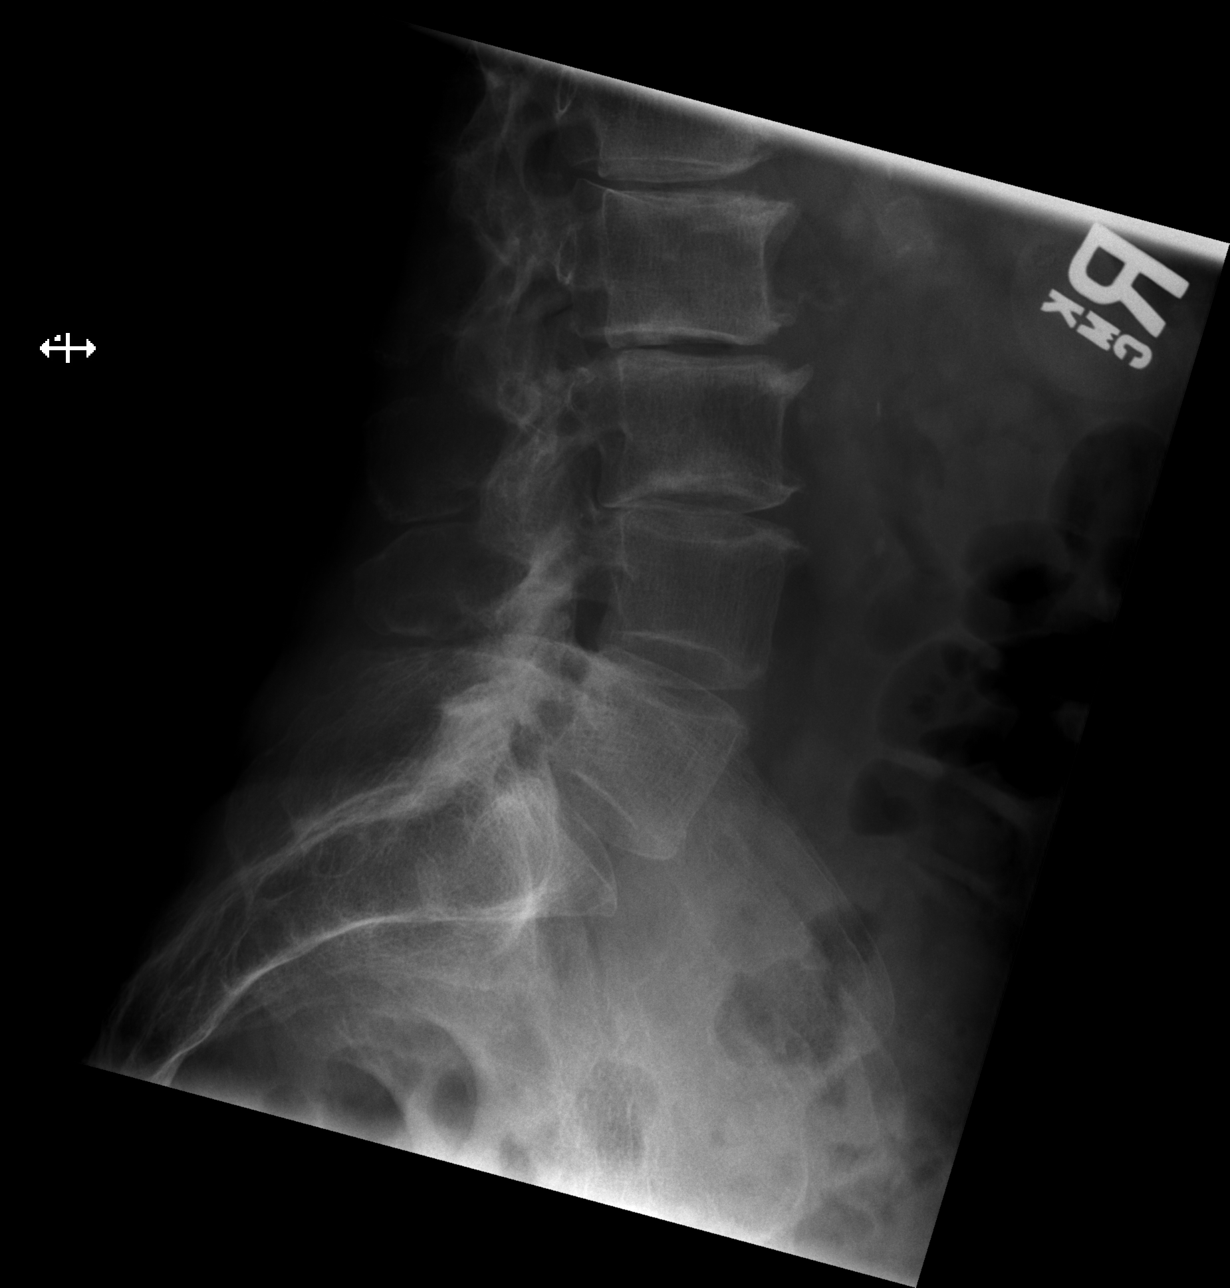

[5 of 5 positions shown; findings below may reference images not displayed]

FINDINGS: Subtle curvature convex left. There is mild to moderate spondylosis
of the lumbar spine to include facet arthropathy most prominent over
the lower lumbar spine. Mild disc space narrowing at the L2-3 level
and to lesser extent at the L1-2 and L3-4 levels. No compression
fracture or spondylolisthesis.
IMPRESSION: No acute findings.

Mild to moderate spondylosis of the lumbar spine to include facet
arthropathy. Multilevel disc disease as described most prominent at
the L2-3 level.
